# Patient Record
Sex: Female | Born: 1960 | Race: White | Hispanic: No | Marital: Married | Smoking: Never smoker
Health system: Southern US, Community
[De-identification: ages and names within clinical notes are randomized; demographics above are authoritative.]

## PROBLEM LIST (undated history)

## (undated) DIAGNOSIS — I10 Essential (primary) hypertension: Secondary | ICD-10-CM

## (undated) HISTORY — DX: Essential (primary) hypertension: I10

---

## 2000-08-27 ENCOUNTER — Encounter: Admission: RE | Admit: 2000-08-27 | Discharge: 2000-08-27 | Payer: Self-pay | Admitting: Obstetrics and Gynecology

## 2000-08-27 ENCOUNTER — Encounter: Payer: Self-pay | Admitting: Obstetrics and Gynecology

## 2001-09-24 ENCOUNTER — Encounter: Payer: Self-pay | Admitting: Obstetrics and Gynecology

## 2001-09-24 ENCOUNTER — Encounter: Admission: RE | Admit: 2001-09-24 | Discharge: 2001-09-24 | Payer: Self-pay | Admitting: Obstetrics and Gynecology

## 2002-11-07 ENCOUNTER — Encounter: Admission: RE | Admit: 2002-11-07 | Discharge: 2002-11-07 | Payer: Self-pay | Admitting: Obstetrics and Gynecology

## 2002-11-07 ENCOUNTER — Encounter: Payer: Self-pay | Admitting: Obstetrics and Gynecology

## 2003-12-22 ENCOUNTER — Encounter: Admission: RE | Admit: 2003-12-22 | Discharge: 2003-12-22 | Payer: Self-pay | Admitting: Obstetrics and Gynecology

## 2003-12-26 ENCOUNTER — Encounter: Admission: RE | Admit: 2003-12-26 | Discharge: 2003-12-26 | Payer: Self-pay | Admitting: Obstetrics and Gynecology

## 2003-12-26 ENCOUNTER — Encounter (INDEPENDENT_AMBULATORY_CARE_PROVIDER_SITE_OTHER): Payer: Self-pay | Admitting: Specialist

## 2005-01-15 ENCOUNTER — Encounter: Admission: RE | Admit: 2005-01-15 | Discharge: 2005-01-15 | Payer: Self-pay | Admitting: Obstetrics and Gynecology

## 2005-08-01 ENCOUNTER — Encounter: Admission: RE | Admit: 2005-08-01 | Discharge: 2005-08-01 | Payer: Self-pay | Admitting: Obstetrics and Gynecology

## 2006-01-30 ENCOUNTER — Encounter: Admission: RE | Admit: 2006-01-30 | Discharge: 2006-01-30 | Payer: Self-pay | Admitting: Obstetrics and Gynecology

## 2006-02-28 ENCOUNTER — Emergency Department (HOSPITAL_COMMUNITY): Admission: EM | Admit: 2006-02-28 | Discharge: 2006-02-28 | Payer: Self-pay | Admitting: Emergency Medicine

## 2007-02-11 ENCOUNTER — Encounter: Admission: RE | Admit: 2007-02-11 | Discharge: 2007-02-11 | Payer: Self-pay | Admitting: Obstetrics and Gynecology

## 2007-06-29 ENCOUNTER — Ambulatory Visit (HOSPITAL_COMMUNITY): Admission: RE | Admit: 2007-06-29 | Discharge: 2007-06-29 | Payer: Self-pay | Admitting: Pulmonary Disease

## 2007-10-27 ENCOUNTER — Emergency Department (HOSPITAL_COMMUNITY): Admission: EM | Admit: 2007-10-27 | Discharge: 2007-10-27 | Payer: Self-pay | Admitting: Emergency Medicine

## 2008-03-02 ENCOUNTER — Encounter: Admission: RE | Admit: 2008-03-02 | Discharge: 2008-03-02 | Payer: Self-pay | Admitting: Obstetrics and Gynecology

## 2008-03-30 ENCOUNTER — Ambulatory Visit (HOSPITAL_COMMUNITY): Admission: RE | Admit: 2008-03-30 | Discharge: 2008-03-30 | Payer: Self-pay | Admitting: Pulmonary Disease

## 2009-01-12 ENCOUNTER — Ambulatory Visit (HOSPITAL_COMMUNITY): Admission: RE | Admit: 2009-01-12 | Discharge: 2009-01-12 | Payer: Self-pay | Admitting: Pulmonary Disease

## 2009-03-23 ENCOUNTER — Encounter: Admission: RE | Admit: 2009-03-23 | Discharge: 2009-03-23 | Payer: Self-pay | Admitting: Obstetrics and Gynecology

## 2009-06-22 IMAGING — US US PELVIS COMPLETE MODIFY
1 series · 14 of 25 positions shown · non-contrast
Comparison: None.

CLINICAL DATA: Abdominal pain, diffuse pelvic pain. 
 TRANSABDOMINAL AND TRANSVAGINAL PELVIC ULTRASOUND ? 10/27/07:
TECHNIQUE: Both transabdominal and transvaginal ultrasound examinations of the pelvis were performed including evaluation of the uterus, ovaries, adnexal regions, and pelvic cul-de-sac.

[Series 1: us pelvis complete modify · 0.28mm/px · 14 of 26 slices shown]
[im 1/26]
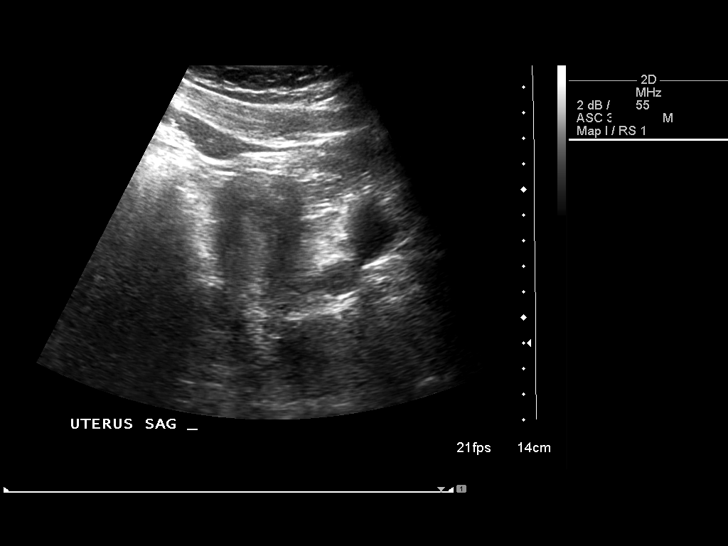
[im 3/26]
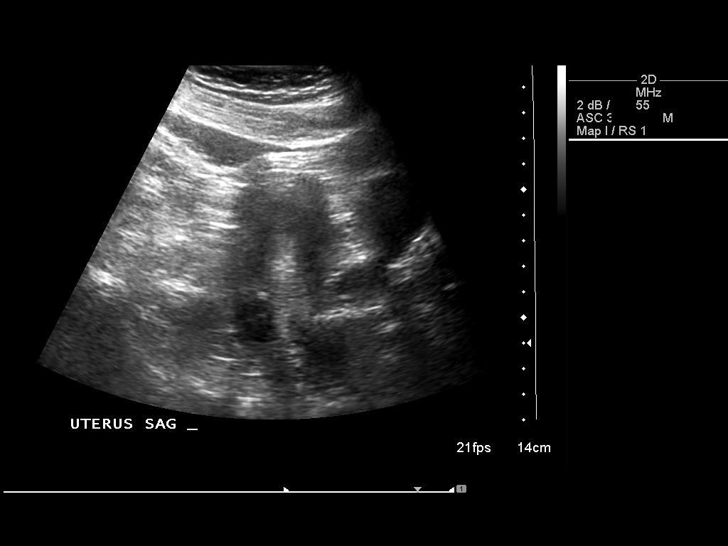
[im 5/26]
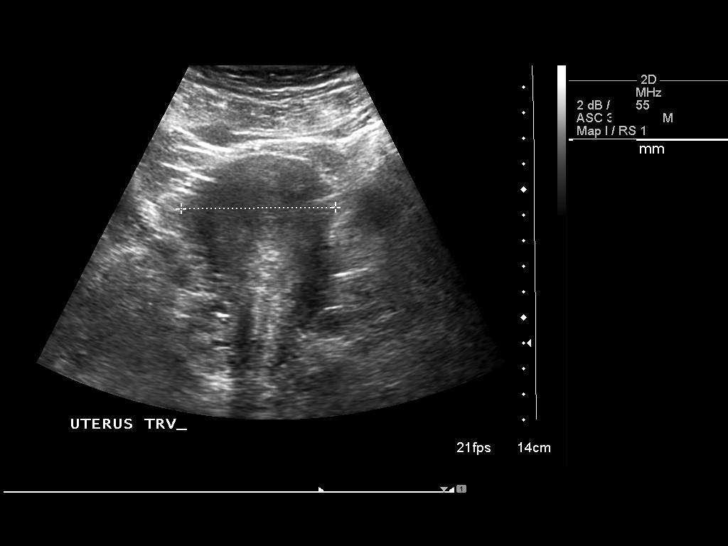
[im 7/26]
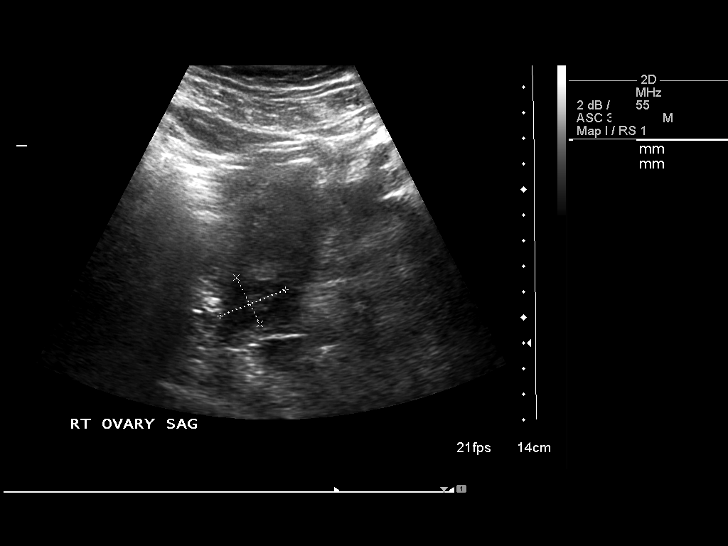
[im 9/26]
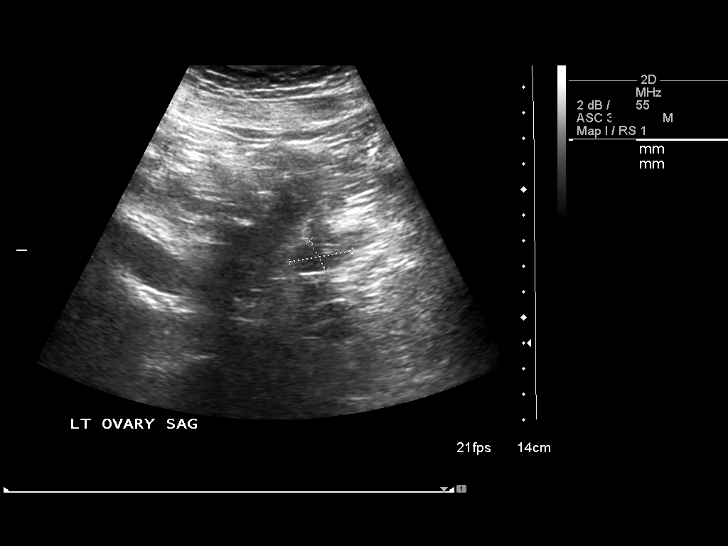
[im 10/26]
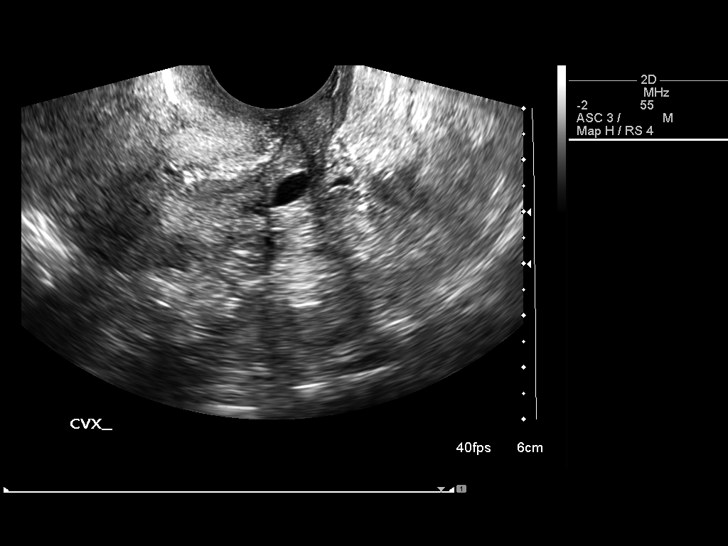
[im 12/26]
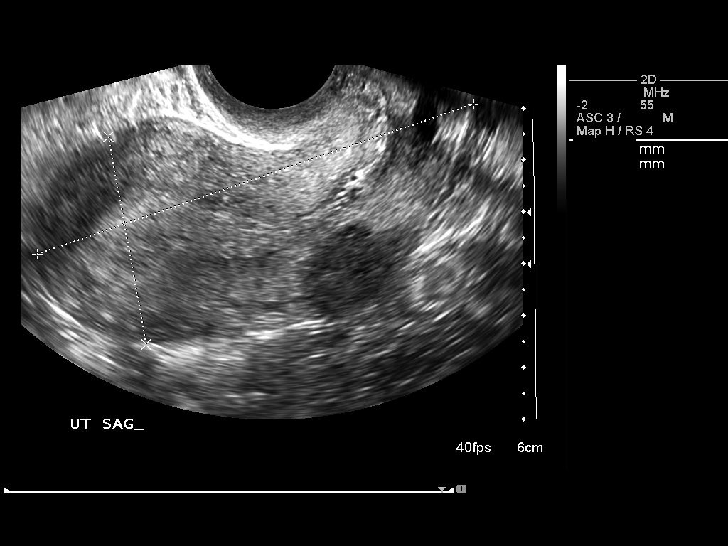
[im 14/26]
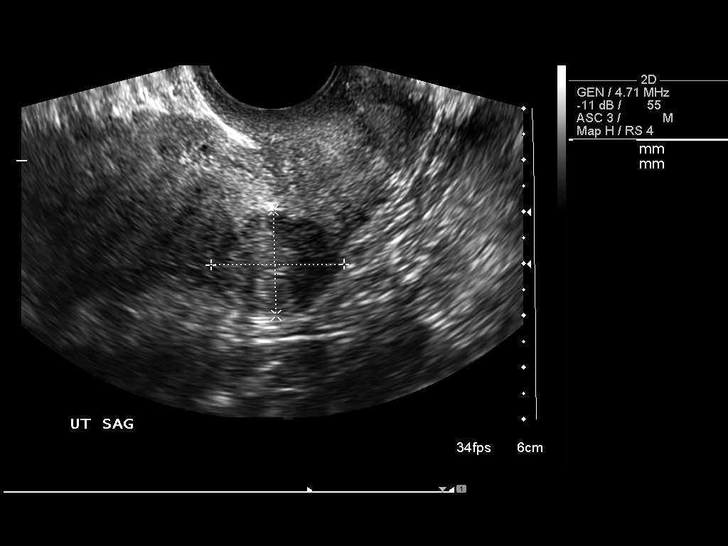
[im 16/26]
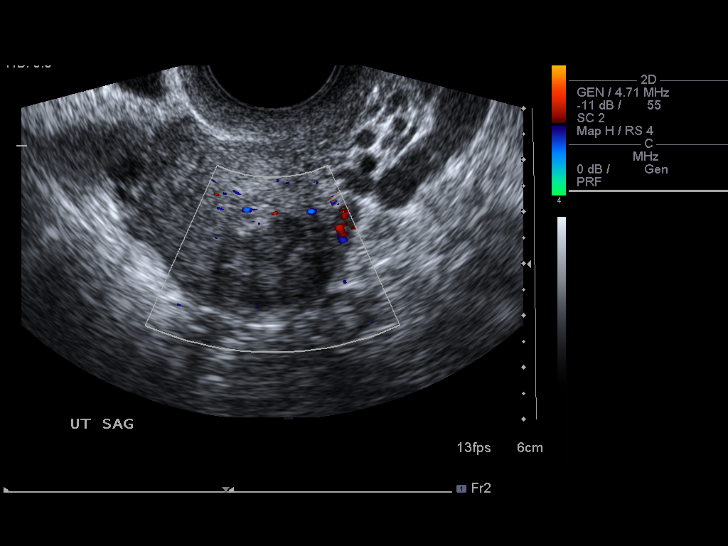
[im 17/26]
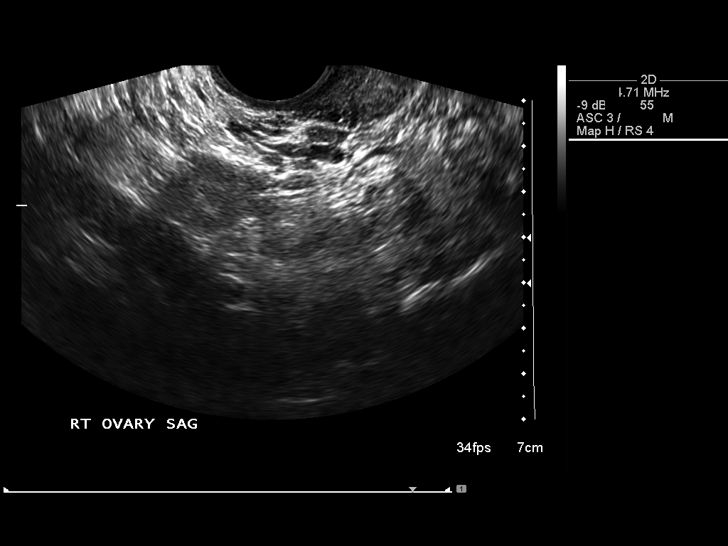
[im 19/26]
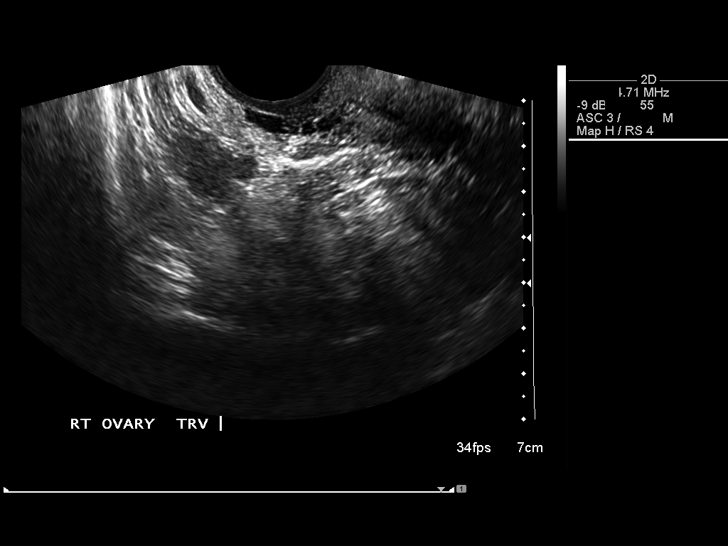
[im 21/26]
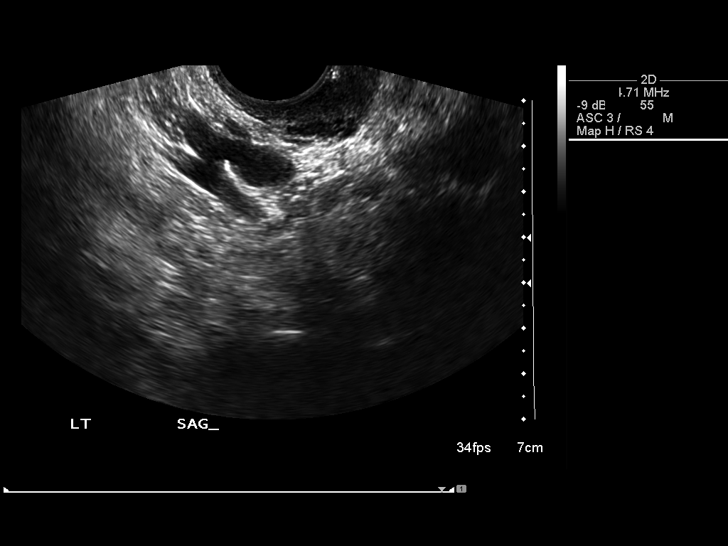
[im 23/26]
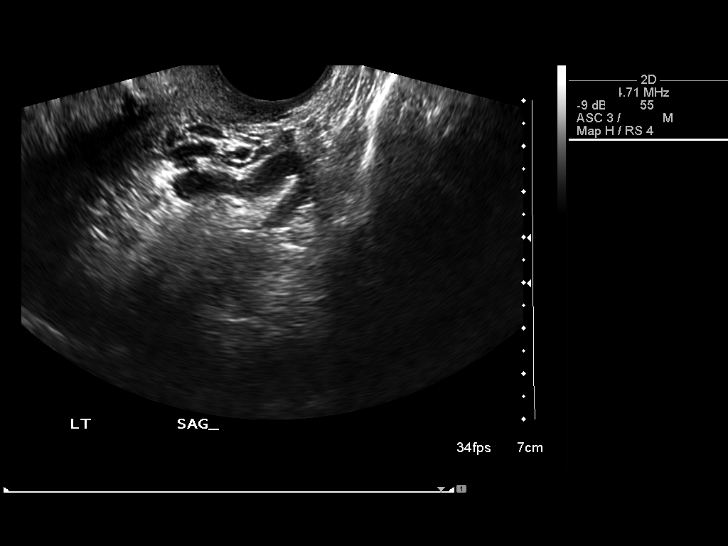
[im 26/26]
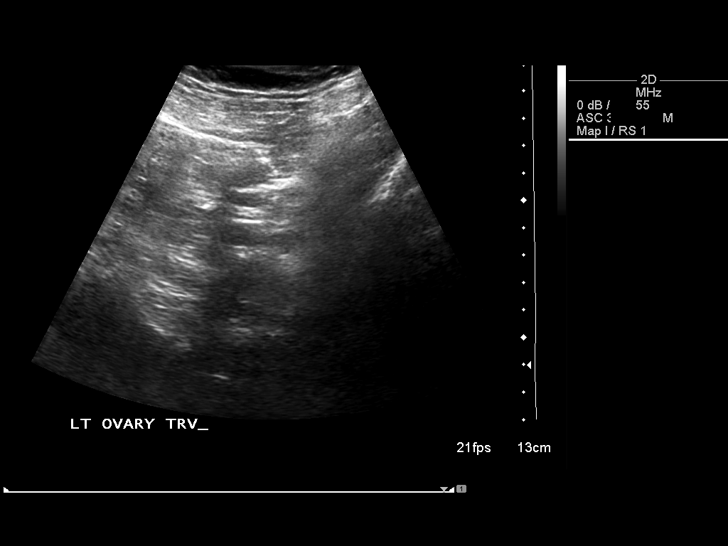

[14 of 25 positions shown; findings below may reference images not displayed]

FINDINGS: The uterus measures 8.0 x 3.7 x 6.0 cm and contains a 2.6 x 2.0 x 2.4 cm hypoechoic lesion.  Endometrial stripe measures 6 mm.  The ovaries are unremarkable.  No free fluid.
IMPRESSION: No acute findings.  Small uterine fibroid.

## 2010-04-04 ENCOUNTER — Encounter
Admission: RE | Admit: 2010-04-04 | Discharge: 2010-04-04 | Payer: Self-pay | Source: Home / Self Care | Admitting: Obstetrics and Gynecology

## 2010-09-08 IMAGING — CR DG FOOT COMPLETE 3+V*L*
3 series · 3 of 3 positions shown · non-contrast
Comparison: None

CLINICAL DATA: Medial left foot pain

LEFT FOOT - COMPLETE 3+ VIEW

[view not recorded (1 of 3)]
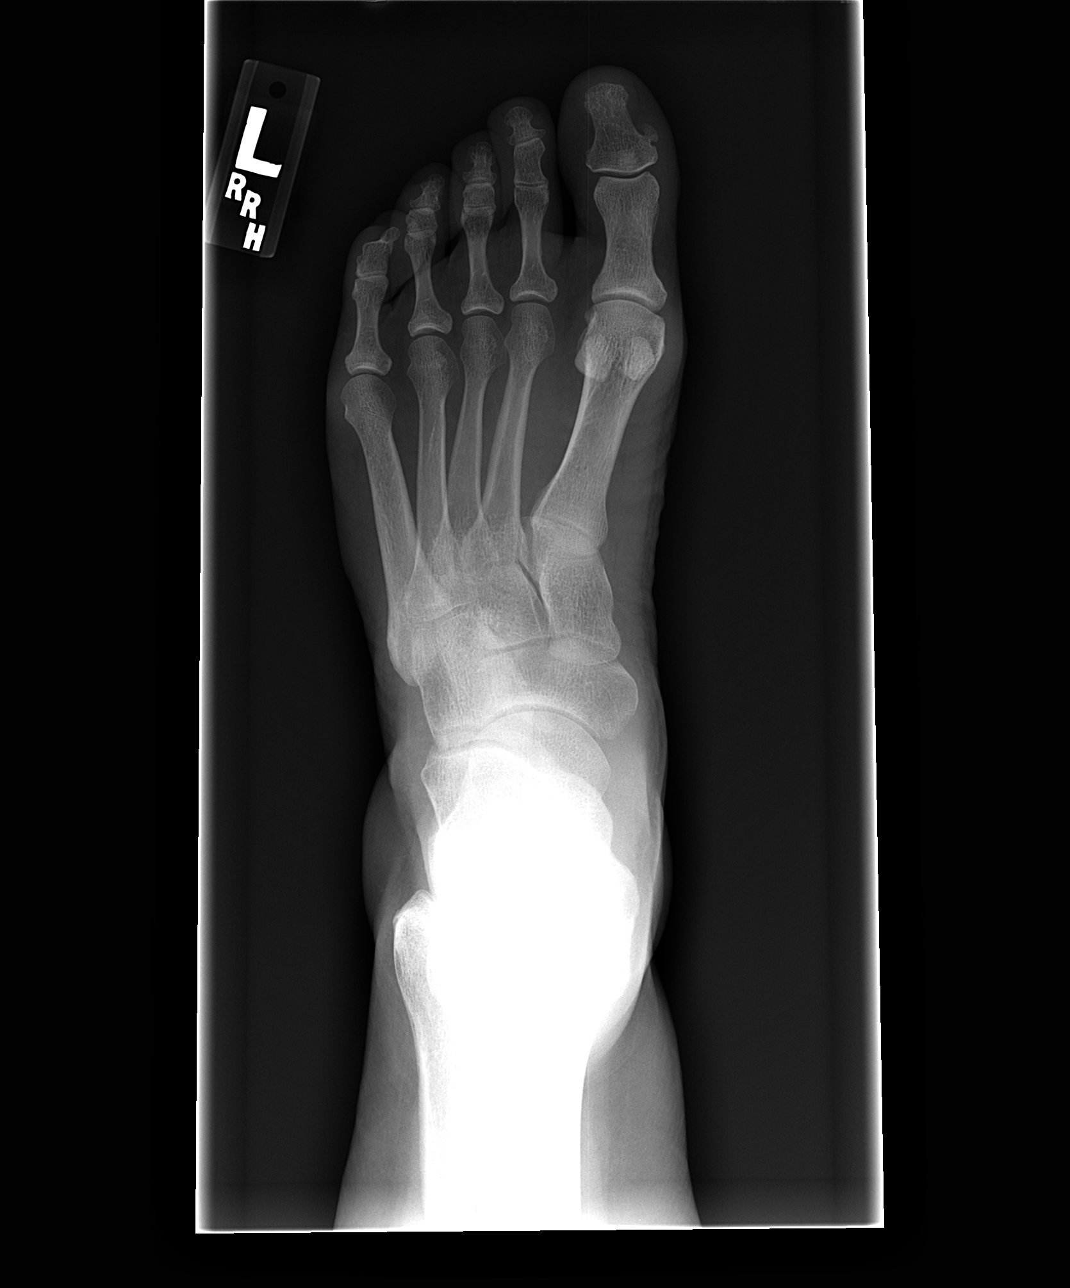

[view not recorded (2 of 3)]
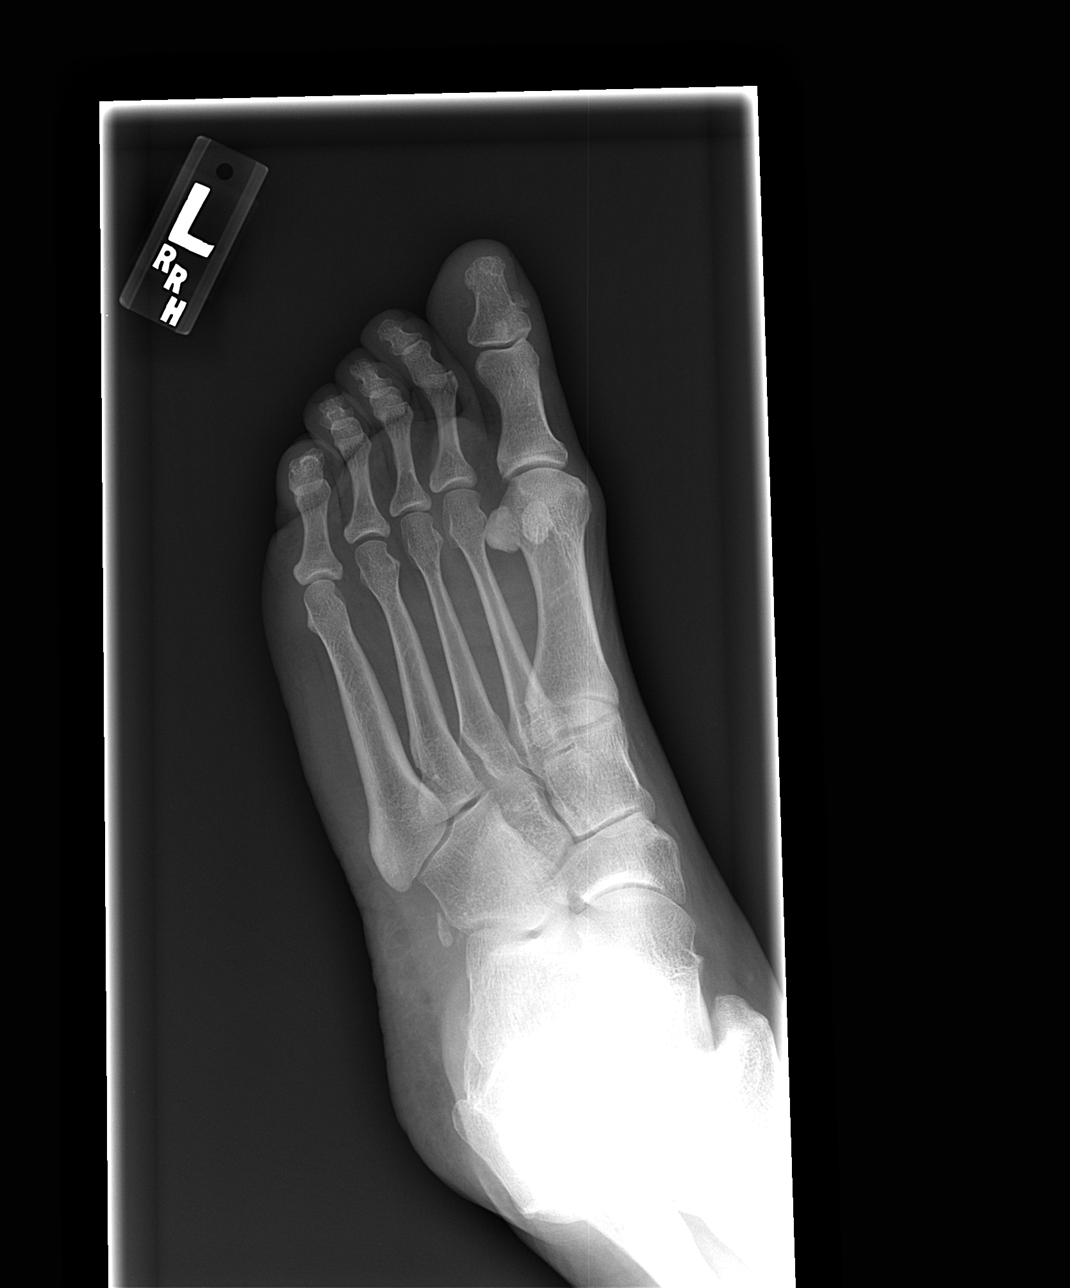

[view not recorded (3 of 3)]
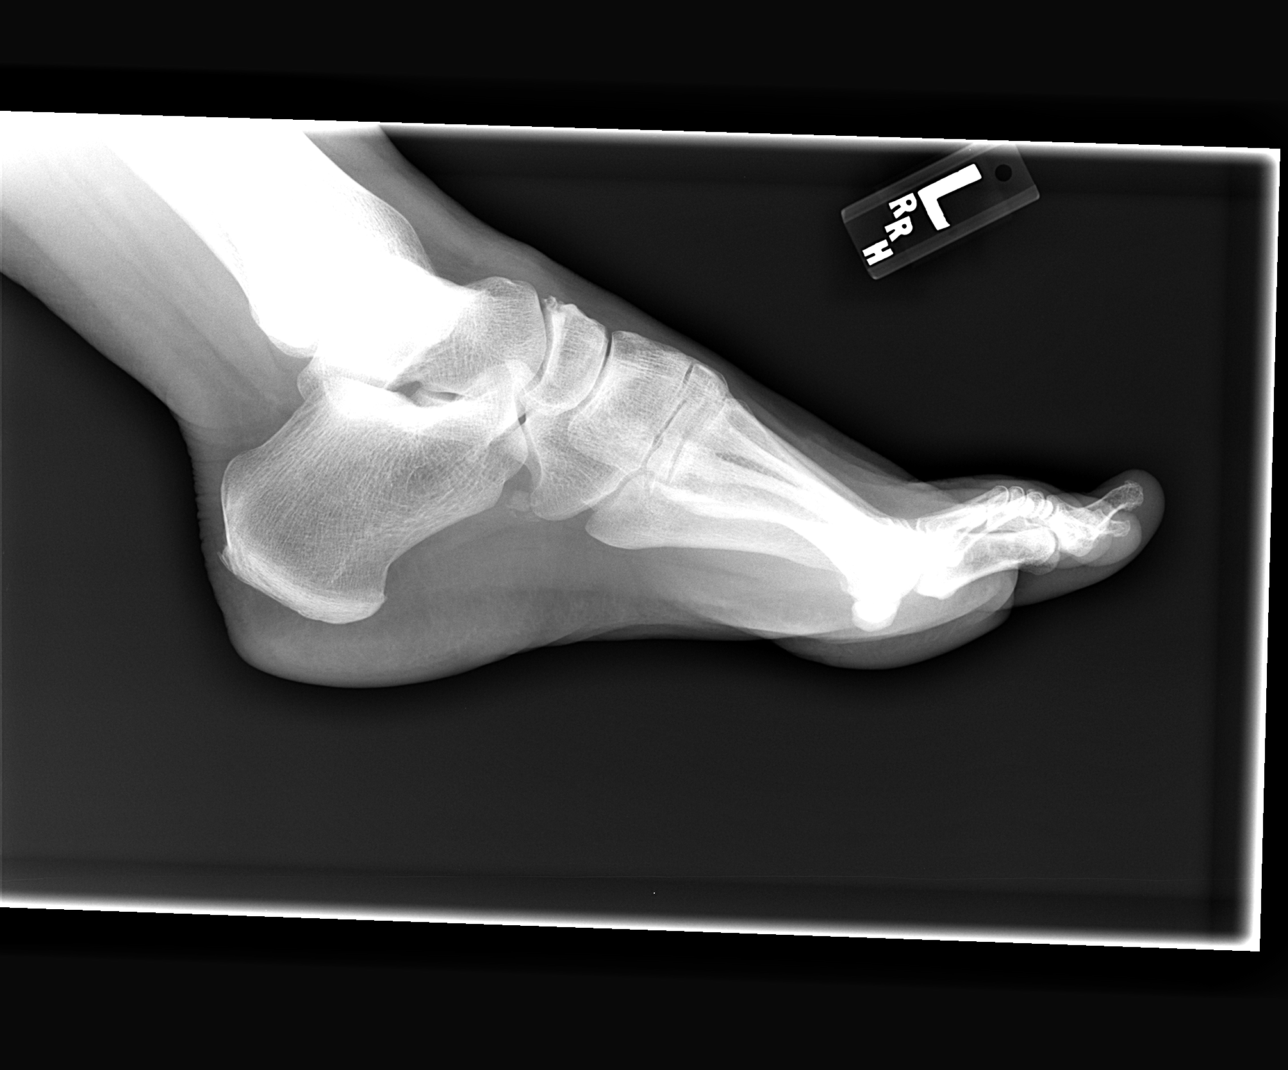

[3 of 3 positions shown; findings below may reference images not displayed]

FINDINGS: Minimal bony demineralization.
Joint spaces preserved.
Tiny bony excrescence at base of distal phalanx great toe, benign
in appearance, question tiny osteochondroma.
No acute fracture, dislocation, or bone destruction.
Accessory ossicles at lateral margin of calcaneocuboid joint.
IMPRESSION: No acute bony abnormalities as above.
Question tiny osteochondroma distal phalanx left great toe.

## 2010-11-19 ENCOUNTER — Encounter: Payer: Self-pay | Admitting: Internal Medicine

## 2010-11-30 ENCOUNTER — Encounter: Payer: Self-pay | Admitting: Obstetrics and Gynecology

## 2010-12-06 ENCOUNTER — Ambulatory Visit (HOSPITAL_COMMUNITY)
Admission: RE | Admit: 2010-12-06 | Discharge: 2010-12-06 | Payer: Self-pay | Source: Home / Self Care | Attending: Internal Medicine | Admitting: Internal Medicine

## 2010-12-09 HISTORY — PX: COLONOSCOPY: SHX174

## 2010-12-10 NOTE — Op Note (Signed)
  NAME:  Kara Hernandez, Kara Hernandez             ACCOUNT NO.:  [id number]  MEDICAL RECORD NO.:  [id number]          PATIENT TYPE:  AMB  LOCATION:  DAY                           FACILITY:  [facility]  PHYSICIAN:  R. Roetta Sessions, M.D. DATE OF BIRTH:  1961/07/13  DATE OF PROCEDURE:  12/06/2010 DATE OF DISCHARGE:                              OPERATIVE REPORT   PROCEDURE:  Screening colonoscopy.  INDICATIONS FOR PROCEDURE:  A 50 year old lady with no lower GI tract symptoms, comes for first ever screening colonoscopy.  No family history of colon polyps or colon cancer.  She has had problems with an anal fissure in the past with intermittent bleeding, but no symptoms now. Colonoscopy is now being done as standard screening maneuver.  Risks, benefits, limitations, alternatives, and imponderables have been discussed, questions were answered.  Please see the documentation medical record.  PROCEDURE NOTE:  O2 saturation, blood pressure, pulse, and respirations monitored throughout the entire procedure.  CONSCIOUS SEDATION:  Versed 6 mg IV and Demerol 100 mg IV in divided doses.  INSTRUMENT:  Pentax video chip system.  FINDINGS:  Digital rectal exam revealed no abnormalities.  Endoscopic findings:  Prep was good.  Colon:  Colonic mucosa was surveyed from the rectosigmoid junction through the left transverse right colon to the appendiceal orifice, ileocecal valve/cecum.  These structures were well seen and photographed for the record.  From this level, scope was slowly and cautiously withdrawn.  All previous mentioned mucosal surfaces were again seen.  The patient had a few sigmoid diverticula colonic mucosa appeared entirely normal.  The scope was pulled down into the rectum.  A thorough examination of the rectal mucosa including retroflex view of the anal verge.  En face view of the anal canal demonstrated a couple of anal papilla only.  The patient tolerated the procedure well.  Cecal withdrawal time  9 minutes.  IMPRESSION: 1. Anal papilla, otherwise normal rectum. 2. A few sigmoid diverticula, remaining of colonic mucosa appeared     normal.  RECOMMENDATIONS: 1. Diverticulosis literature provided to Ms. Merton Border. 2. Repeat screening colonoscopy in 2 years.     Jonathon Bellows, M.D.     RMR/MEDQ  D:  12/06/2010  T:  12/06/2010  Job:  [id number]  cc:   Catalina Pizza, M.D. Fax: [phone]  Electronically Signed by Lorrin Goodell M.D. on 12/10/2010 01:41:38 PM

## 2010-12-12 NOTE — Letter (Signed)
Summary: [facility] TRI[facility]  [facility] TRIAGE   Imported By: Rexene Alberts 11/19/2010 12:14:50  _____________________________________________________________________  External Attachment:    Type:   Image     Comment:   External Document  Appended Document: [facility] TRIAGE ok as is  Appended Document: [facility] TRIAGE Rx and instructions mailed and order faxed to [facility].   Appended Document: [facility] TRIAGE Pt called and rescheduled her colonoscopy appt from 11/29/2010 until 12/06/2010 @ 7:30 AM. Selena Batten is aware.

## 2011-04-16 ENCOUNTER — Other Ambulatory Visit: Payer: Self-pay | Admitting: Obstetrics and Gynecology

## 2011-04-16 DIAGNOSIS — Z1231 Encounter for screening mammogram for malignant neoplasm of breast: Secondary | ICD-10-CM

## 2011-05-08 ENCOUNTER — Ambulatory Visit
Admission: RE | Admit: 2011-05-08 | Discharge: 2011-05-08 | Disposition: A | Discharge status: Home / Self Care | Payer: BC Managed Care – PPO | Source: Ambulatory Visit | Attending: Obstetrics and Gynecology | Admitting: Obstetrics and Gynecology

## 2011-05-08 DIAGNOSIS — Z1231 Encounter for screening mammogram for malignant neoplasm of breast: Secondary | ICD-10-CM

## 2011-08-15 LAB — CBC
MCHC: 33.9
MCV: 87.5
RBC: 4.51
RDW: 13.6

## 2011-08-15 LAB — DIFFERENTIAL
Basophils Absolute: 0
Basophils Relative: 1
Eosinophils Absolute: 0 — ABNORMAL LOW
Monocytes Absolute: 0.4
Neutrophils Relative %: 81 — ABNORMAL HIGH

## 2011-08-15 LAB — URINALYSIS, ROUTINE W REFLEX MICROSCOPIC
Bilirubin Urine: NEGATIVE
Ketones, ur: NEGATIVE
Leukocytes, UA: NEGATIVE
Specific Gravity, Urine: 1.03

## 2011-08-15 LAB — COMPREHENSIVE METABOLIC PANEL
ALT: 21
AST: 22
Alkaline Phosphatase: 73
CO2: 26
Calcium: 9
GFR calc Af Amer: >60
Sodium: 140

## 2011-08-15 LAB — WET PREP, GENITAL
Trich, Wet Prep: NONE SEEN
Yeast Wet Prep HPF POC: NONE SEEN

## 2011-08-15 LAB — GC/CHLAMYDIA PROBE AMP, GENITAL: Chlamydia, DNA Probe: NEGATIVE

## 2011-08-15 LAB — PREGNANCY, URINE: Preg Test, Ur: NEGATIVE

## 2011-08-15 LAB — LIPASE, BLOOD: Lipase: 33

## 2012-05-18 ENCOUNTER — Other Ambulatory Visit: Payer: Self-pay | Admitting: Obstetrics and Gynecology

## 2012-05-18 DIAGNOSIS — Z1231 Encounter for screening mammogram for malignant neoplasm of breast: Secondary | ICD-10-CM

## 2012-06-04 ENCOUNTER — Ambulatory Visit
Admission: RE | Admit: 2012-06-04 | Discharge: 2012-06-04 | Disposition: A | Discharge status: Home / Self Care | Payer: 59 | Source: Ambulatory Visit | Attending: Obstetrics and Gynecology | Admitting: Obstetrics and Gynecology

## 2012-06-04 DIAGNOSIS — Z1231 Encounter for screening mammogram for malignant neoplasm of breast: Secondary | ICD-10-CM

## 2012-08-12 ENCOUNTER — Other Ambulatory Visit: Payer: Self-pay | Admitting: Obstetrics and Gynecology

## 2013-10-27 ENCOUNTER — Encounter: Payer: Self-pay | Admitting: Internal Medicine

## 2016-09-11 ENCOUNTER — Encounter: Payer: Self-pay | Admitting: Internal Medicine

## 2016-09-25 ENCOUNTER — Encounter: Payer: Self-pay | Admitting: Nurse Practitioner

## 2016-09-25 ENCOUNTER — Ambulatory Visit (INDEPENDENT_AMBULATORY_CARE_PROVIDER_SITE_OTHER): Payer: 59 | Admitting: Nurse Practitioner

## 2016-09-25 DIAGNOSIS — K921 Melena: Secondary | ICD-10-CM | POA: Diagnosis not present

## 2016-09-25 DIAGNOSIS — R195 Other fecal abnormalities: Secondary | ICD-10-CM

## 2016-09-25 NOTE — Assessment & Plan Note (Signed)
Noted rare hematochezia in the form of toilet tissue hematochezia typically associated with a isolated bout of constipation and/or hard stools. No other GI symptoms. Referral for colonoscopy as noted above.

## 2016-09-25 NOTE — Patient Instructions (Signed)
1. We recommend you have a colonoscopy to evaluate the blood in her stools. 2. Let us know where he would like to go in [place] if they need a referral from us. 3. If the referral must come from primary care or her gynecologist let them know you would like to go to so they can make that referral. 4. Call if you have any questions.

## 2016-09-25 NOTE — Progress Notes (Signed)
Primary Care Physician:  Drucie IpScott Edward Konopka, MD, MD Primary Gastroenterologist:  Dr. Jena Gaussourk  Chief Complaint  Patient presents with  . Blood In Stools    HPI:   Kara Hernandez is a 55 y.o. female who presents On referral from gynecology for heme positive stool. Gynecology notes reviewed. The patient had a routine exam to establish gynecological care. She is given an iFob which found to be positive and she was subsequently referred to us. Review of my chart records show screening colonoscopy completed 12/06/2010 at age 55 and conscious sedation. Findings included a good prep, anal papilla, few sigmoid diverticula, remaining colonic mucosa normal. Recommended repeat screening colonoscopy in 2 years. She is currently 3 years overdue based on that timeframe. It does appear a reminder letter was mailed to the patient.  Today she states she's dogin well overall. Has seen some brbpr on the toilet tissue, seldom, typically after hard stool or episode of constipation. She has changed her diet to low carb and cut out sugar. Does have GERD symptoms occasionally, about every 2-3 months. Denies melena, abdominal pain, N/V, unintentional weight loss, acute changes in bowel habits, fever, chills. Denies chest pain, dyspnea, dizziness, lightheadedness, syncope, near syncope. Denies any other upper or lower GI symptoms.  Past Medical History:  Diagnosis Date  . Hypertension     Past Surgical History:  Procedure Laterality Date  . COLONOSCOPY  12/09/2010    Current Outpatient Prescriptions  Medication Sig Dispense Refill  . valsartan-hydrochlorothiazide (DIOVAN-HCT) 160-12.5 MG tablet      No current facility-administered medications for this visit.     Allergies as of 09/25/2016  . (Not on File)    Family History  Problem Relation Age of Onset  . Colon polyps Brother   . Colon cancer Neg Hx     Social History   Social History  . Marital status: Married    Spouse name: N/A  .  Number of children: N/A  . Years of education: N/A   Occupational History  . Not on file.   Social History Main Topics  . Smoking status: Never Smoker  . Smokeless tobacco: Never Used  . Alcohol use Yes     Comment: socially  . Drug use: No  . Sexual activity: Not on file   Other Topics Concern  . Not on file   Social History Narrative  . No narrative on file    Review of Systems: General: Negative for anorexia, weight loss, fever, chills, fatigue, weakness. ENT: Negative for hoarseness, difficulty swallowing . CV: Negative for chest pain, angina, palpitations, peripheral edema.  Respiratory: Negative for dyspnea at rest, cough, sputum, wheezing.  GI: See history of present illness. Endo: Negative for unusual weight change.  Heme: Negative for bruising or bleeding.   Physical Exam: BP (!) 153/93   Pulse 98   Temp 97.8 F (36.6 C) (Oral)   Ht 5\' 6"  (1.676 m)   Wt 183 lb 3.2 oz (83.1 kg)   BMI 29.57 kg/m  General:   Alert and oriented. Pleasant and cooperative. Well-nourished and well-developed.  Ears:  Normal auditory acuity. Cardiovascular:  S1, S2 present without murmurs appreciated. Extremities without clubbing or edema. Respiratory:  Clear to auscultation bilaterally. No wheezes, rales, or rhonchi. No distress.  Gastrointestinal:  +BS, soft, non-tender and non-distended. No HSM noted. No guarding or rebound. No masses appreciated.  Rectal:  Deferred  Musculoskalatal:  Symmetrical without gross deformities. Neurologic:  Alert and oriented x4;  grossly normal  neurologically. Psych:  Alert and cooperative. Normal mood and affect. Heme/Lymph/Immune: No excessive bruising noted.    09/25/2016 10:17 AM   Disclaimer: This note was dictated with voice recognition software. Similar sounding words can inadvertently be transcribed and may not be corrected upon review.

## 2016-09-25 NOTE — Assessment & Plan Note (Signed)
Per gynecology well woman notes, the patient had heme positive stool with an iFob. She was referred back to us for colonoscopy. Last colon[place]oscopy completed 2012 and technically recommended 2 year repeat although indications likely suggestive of 10 year repeat. The patient stated she was told to repeat in 10 years. Recommended colonoscopy at this time given 5 years since her last colonoscopy and heme positive stool as well as hematochezia as noted below. The patient states she now [place] in [place] and could not do the prep and drive here for the procedure. She would like to select a GI and [place] to perform the colonoscopy. She will let us know. She would like to go to and if she needs a referral from us she will contact us, otherwise she will contact her gynecologist to we refer to a [facility].

## 2016-09-26 NOTE — Progress Notes (Signed)
CC'ED TO PCP
